# Patient Record
Sex: Female | Born: 1980 | Race: White | Hispanic: No | Marital: Single
Health system: Southern US, Community
[De-identification: ages and names within clinical notes are randomized; demographics above are authoritative.]

## PROBLEM LIST (undated history)

## (undated) DIAGNOSIS — F909 Attention-deficit hyperactivity disorder, unspecified type: Secondary | ICD-10-CM

## (undated) DIAGNOSIS — E039 Hypothyroidism, unspecified: Secondary | ICD-10-CM

## (undated) DIAGNOSIS — R110 Nausea: Secondary | ICD-10-CM

## (undated) DIAGNOSIS — G4700 Insomnia, unspecified: Secondary | ICD-10-CM

## (undated) HISTORY — DX: Attention-deficit hyperactivity disorder, unspecified type: F90.9

## (undated) HISTORY — DX: Insomnia, unspecified: G47.00

## (undated) HISTORY — PX: DILATION AND CURETTAGE OF UTERUS: SHX78

## (undated) HISTORY — DX: Nausea: R11.0

## (undated) HISTORY — DX: Hypothyroidism, unspecified: E03.9

---

## 2004-10-15 ENCOUNTER — Emergency Department (HOSPITAL_COMMUNITY): Admission: EM | Admit: 2004-10-15 | Discharge: 2004-10-15 | Payer: Self-pay | Admitting: Emergency Medicine

## 2004-10-18 ENCOUNTER — Emergency Department (HOSPITAL_COMMUNITY): Admission: EM | Admit: 2004-10-18 | Discharge: 2004-10-18 | Payer: Self-pay | Admitting: Emergency Medicine

## 2004-10-21 ENCOUNTER — Emergency Department (HOSPITAL_COMMUNITY): Admission: EM | Admit: 2004-10-21 | Discharge: 2004-10-21 | Payer: Self-pay | Admitting: Emergency Medicine

## 2007-03-14 IMAGING — CT CT ABDOMEN W/ CM
1 of 2 series · 15 of 32 positions shown, 19 images · IV contrast (omnipaque)
Comparison: None available.

CLINICAL DATA: 24-year-old female with abdominal and pelvic pain.  Right lower quadrant pain.  Negative pregnancy test.  
 ABDOMEN CT WITH CONTRAST:
TECHNIQUE: Multidetector CT imaging of the abdomen was performed following the standard protocol during bolus administration of intravenous contrast.
 Contrast:  100 cc Omnipaque 300 and oral contrast
TECHNIQUE: Multidetector CT imaging of the pelvis was performed following the standard protocol during bolus administration of intravenous contrast.
 The bladder, bowel and appendix are unremarkable.  A few small adnexal cysts/follicles are present.  There is no evidence of free fluid or enlarged lymph nodes.  The uterus and adnexal regions are grossly unremarkable.  Consider further evaluation or follow-up as indicated.

[Series 2: routine abdomen · axial · 0.62mm/px · z∈[-382,-42]mm · 15 of 75 slices shown, 19 images]
[im 4/75  soft-tissue]
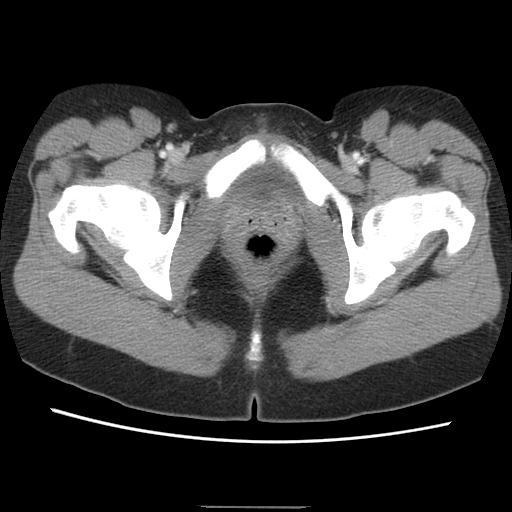
[im 4/75  bone]
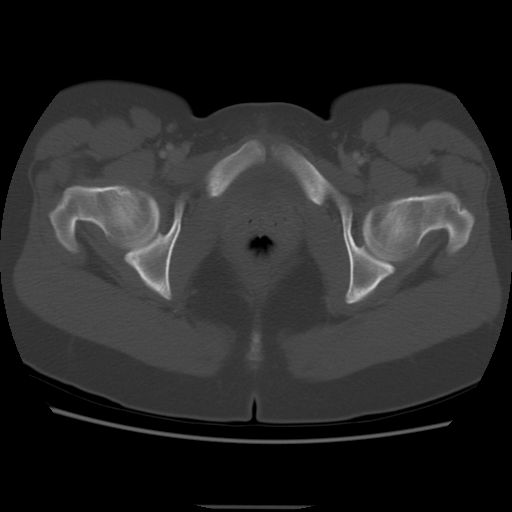
[im 10/75  soft-tissue]
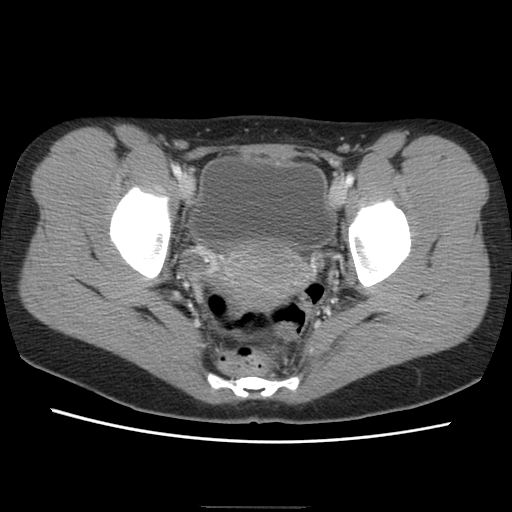
[im 17/75  soft-tissue]
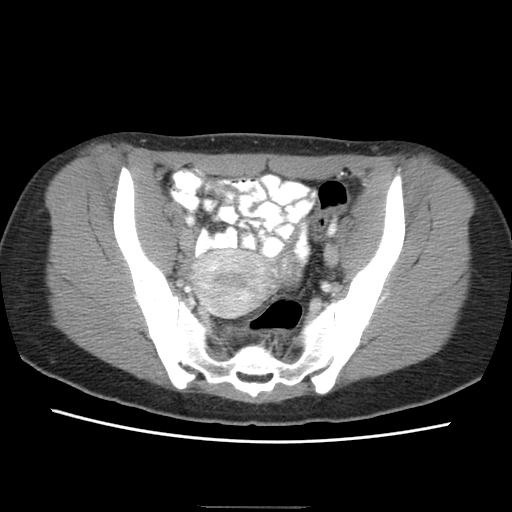
[im 20/75  soft-tissue]
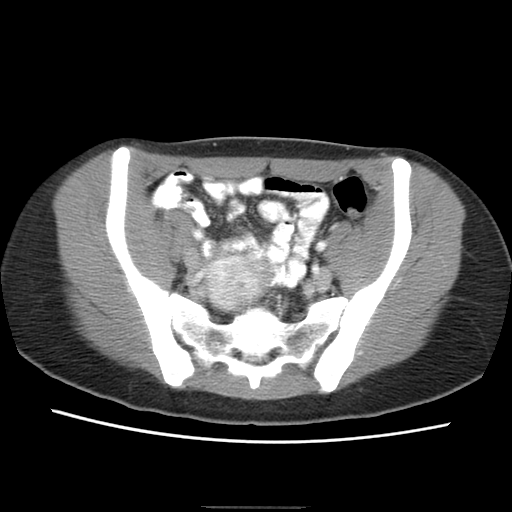
[im 26/75  soft-tissue]
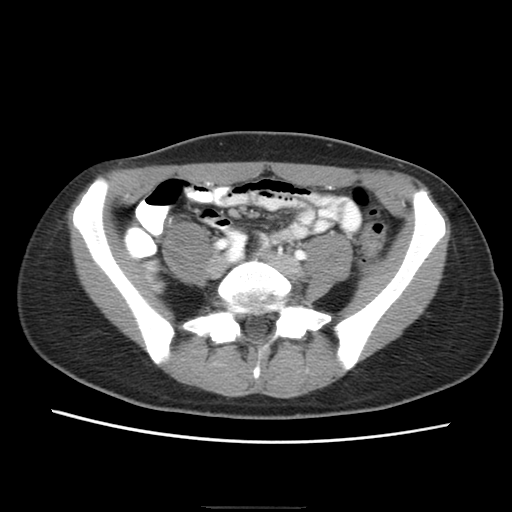
[im 33/75  soft-tissue]
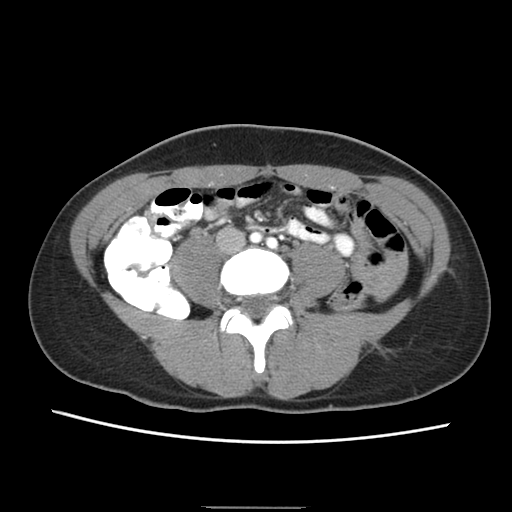
[im 39/75  soft-tissue]
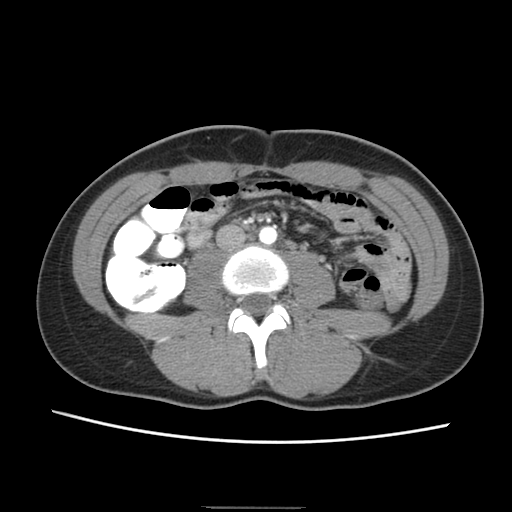
[im 42/75  soft-tissue]
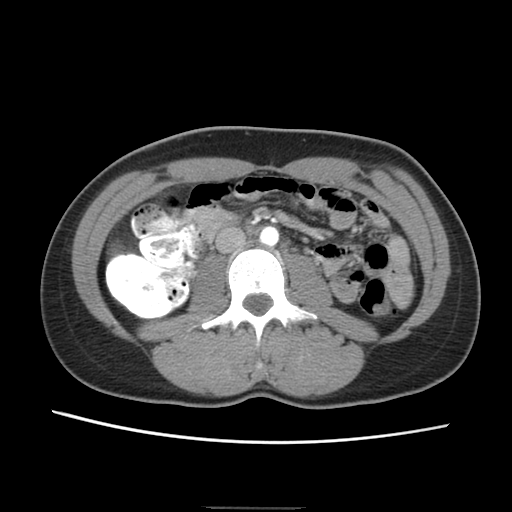
[im 49/75  soft-tissue]
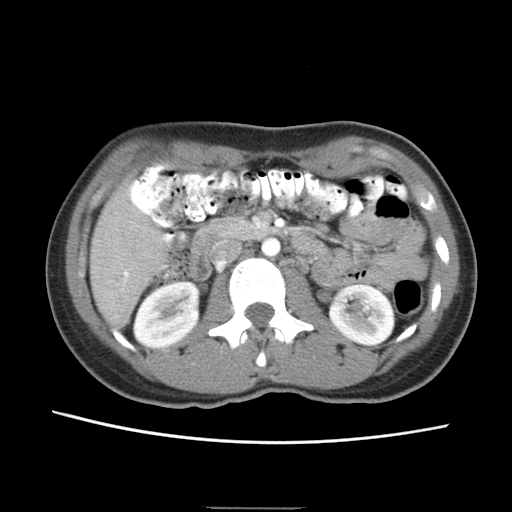
[im 49/75  bone]
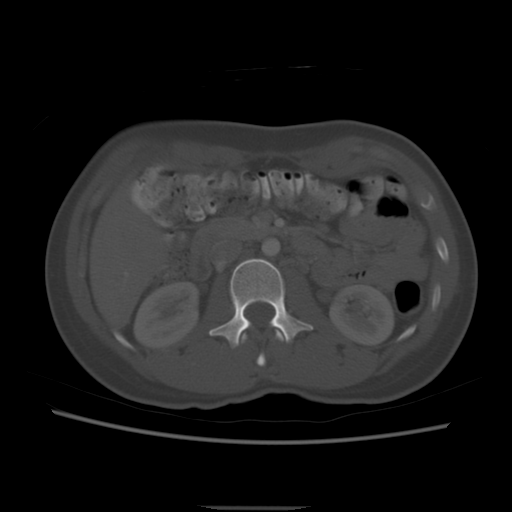
[im 55/75  soft-tissue]
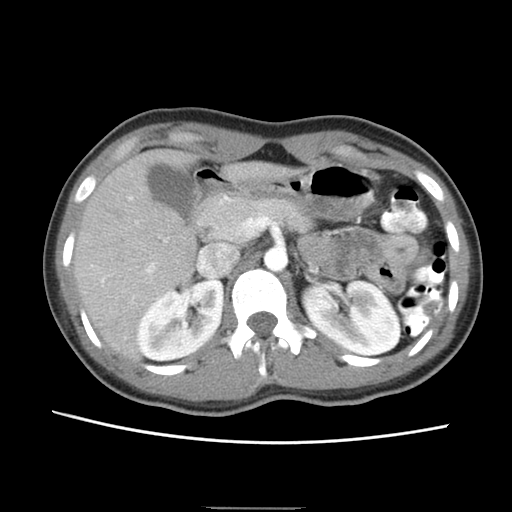
[im 58/75  soft-tissue]
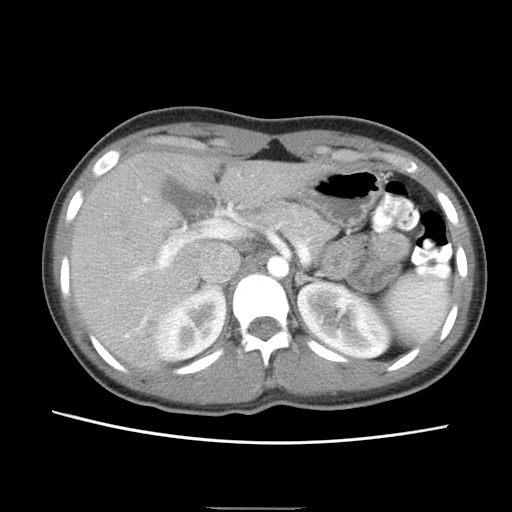
[im 62/75  lung]
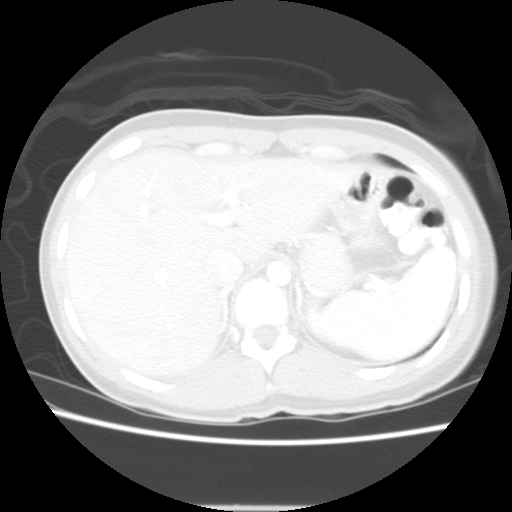
[im 65/75  soft-tissue]
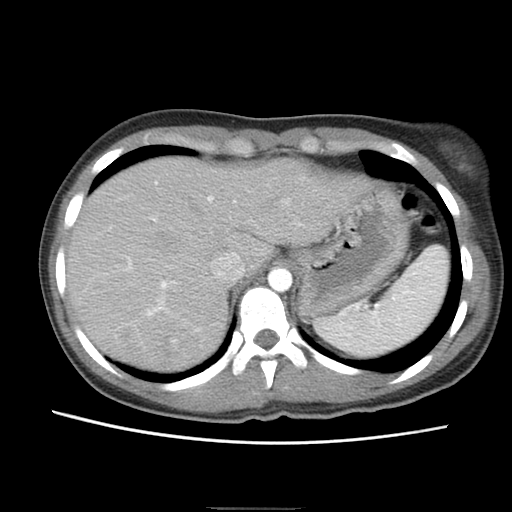
[im 65/75  lung]
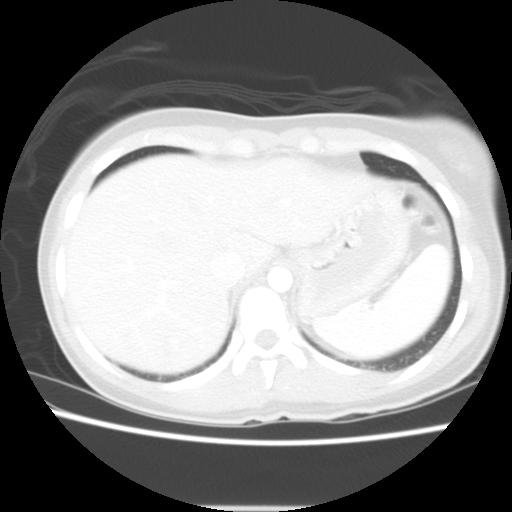
[im 68/75  lung]
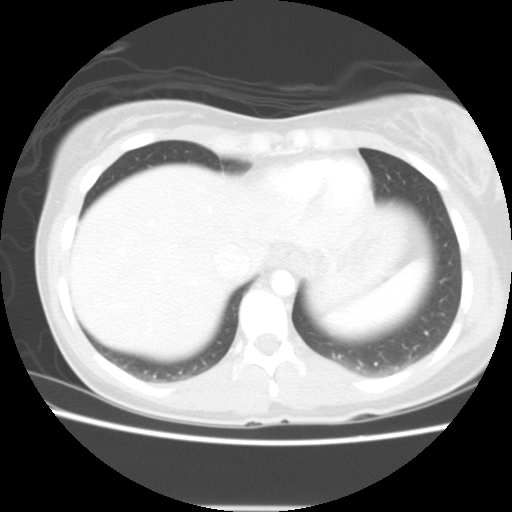
[im 71/75  soft-tissue]
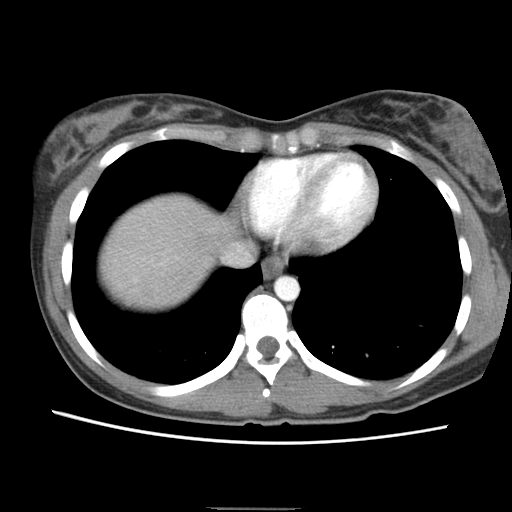
[im 71/75  lung]
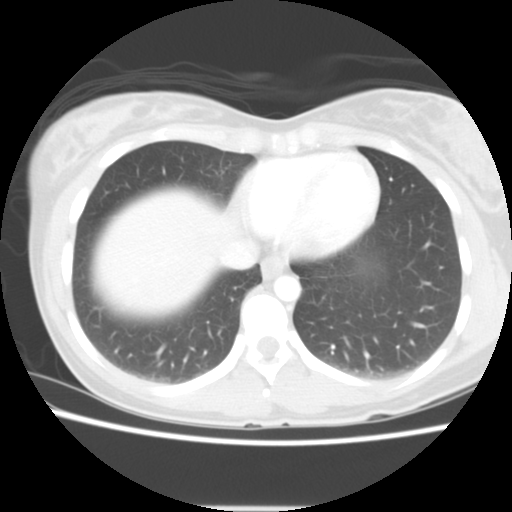

[15 of 32 positions shown; findings below may reference images not displayed]

FINDINGS: The liver, spleen, adrenal glands, kidneys, pancreas, and gallbladder are unremarkable.  No evidence of enlarged lymph nodes, free fluid, abdominal aortic aneurysm, or biliary dilatation.  The visualized bowel is within normal limits.
IMPRESSION: No acute abnormality.  
 PELVIS CT WITH CONTRAST:
IMPRESSION: No evidence of acute abnormality.

## 2007-04-20 ENCOUNTER — Inpatient Hospital Stay (HOSPITAL_COMMUNITY): Admission: AD | Admit: 2007-04-20 | Discharge: 2007-04-20 | Payer: Self-pay | Admitting: Obstetrics and Gynecology

## 2009-09-10 IMAGING — US US OB COMP LESS 14 WK
1 series · 8 of 8 positions shown · non-contrast
Comparison: none

CLINICAL DATA: Seven weeks pregnant and bleeding. Last menstrual period was 7 weeks 0 days prior. 
OBSTETRICAL ULTRASOUND <14 WKS AND TRANSVAGINAL OB US:
TECHNIQUE: Both transabdominal and transvaginal ultrasound examinations were performed for complete evaluation of the gestation as well as the maternal uterus, adnexal regions, and pelvic cul-de-sac.

[Series 1: us ob comp less 14 wk · 0.11mm/px · 8 of 8 slices shown]
[im 1/8]
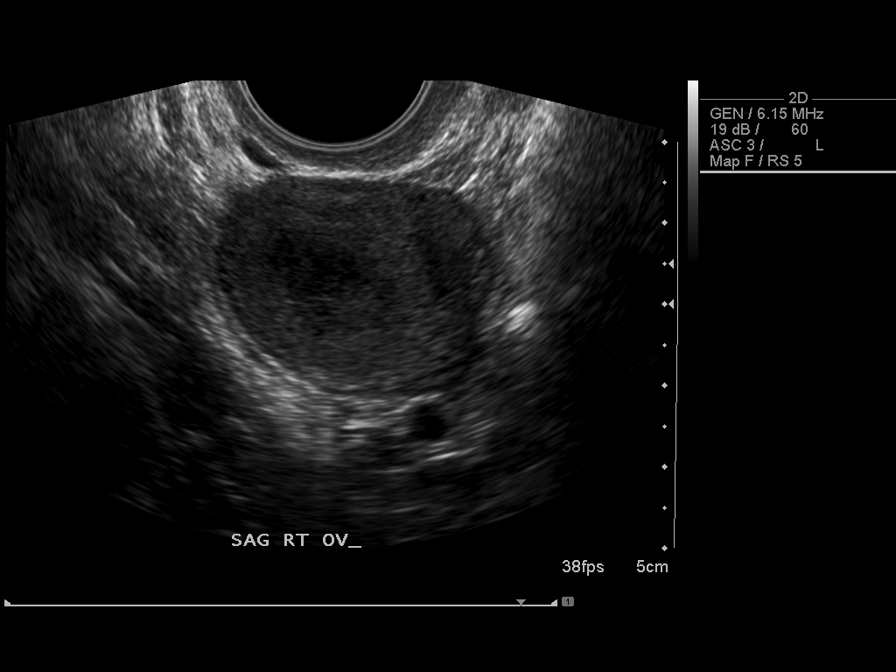
[im 2/8]
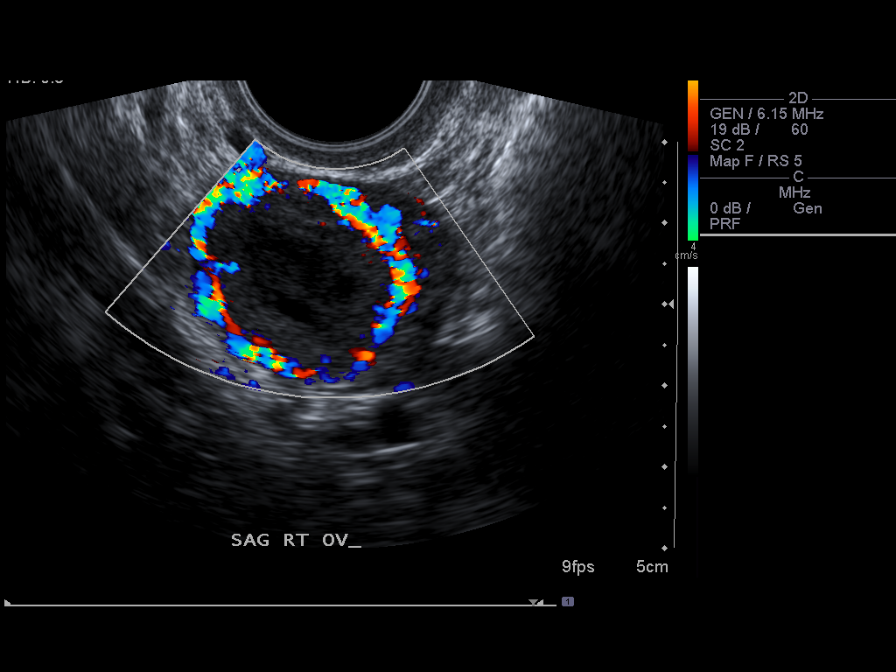
[im 3/8]
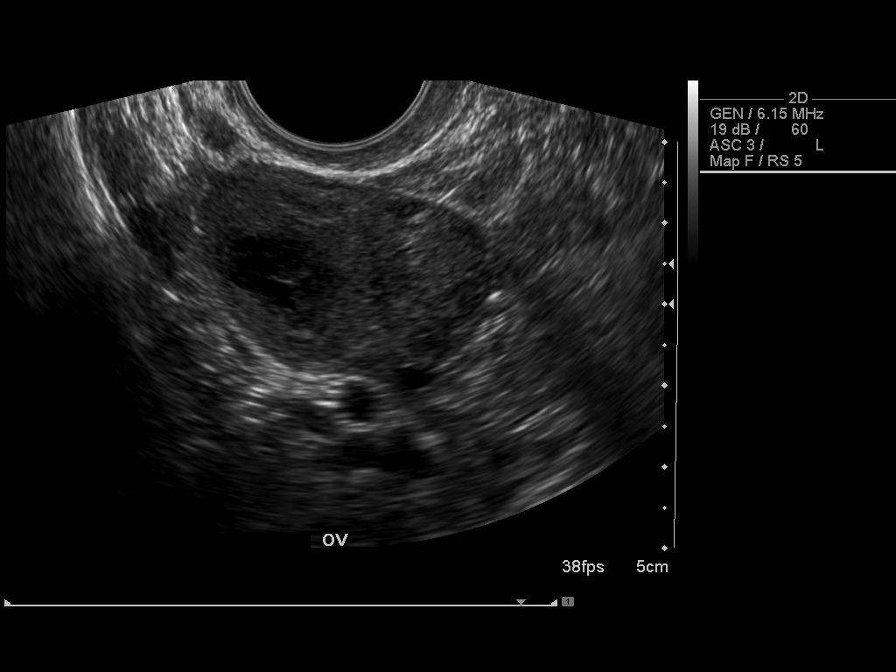
[im 4/8]
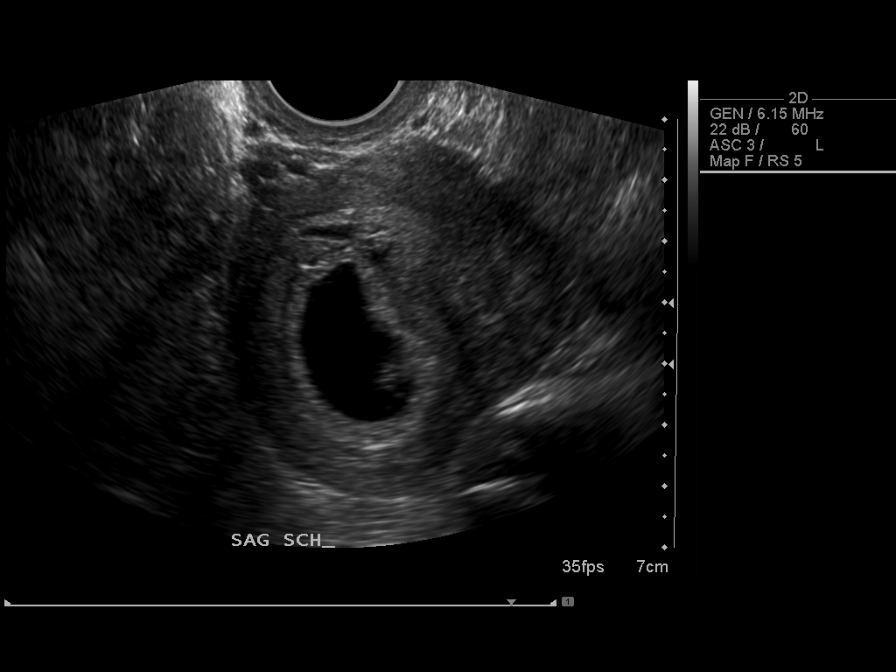
[im 5/8]
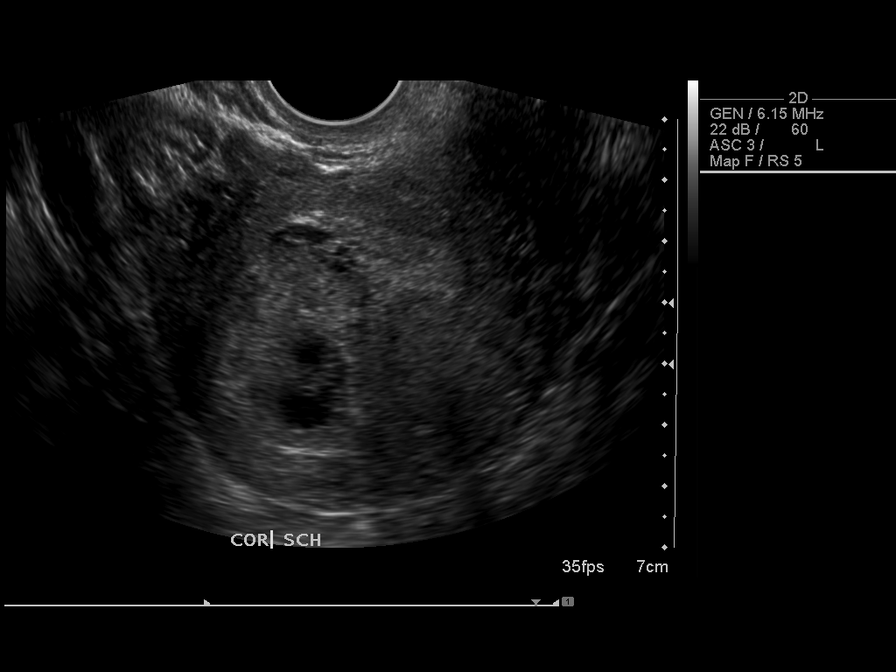
[im 6/8]
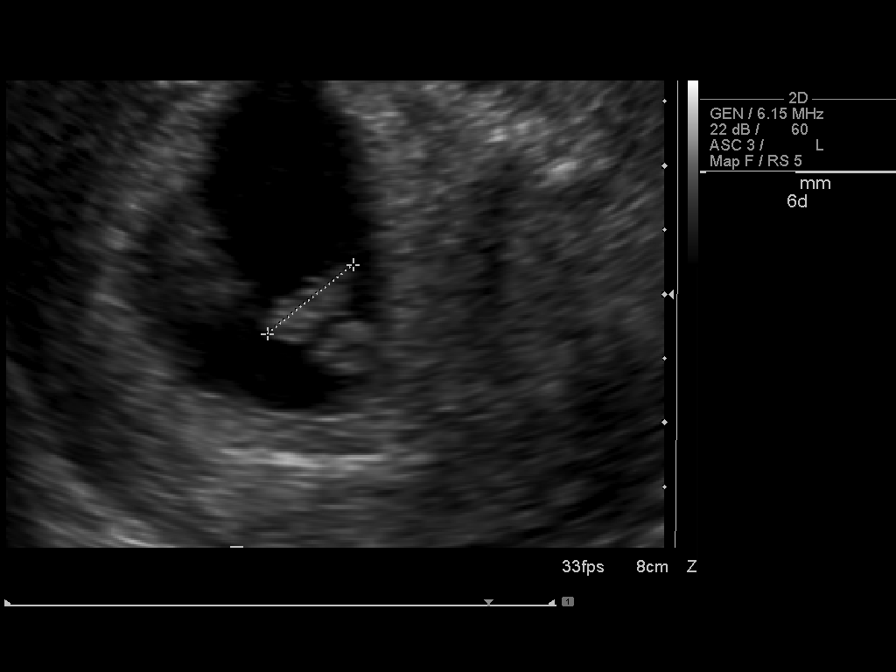
[im 7/8]
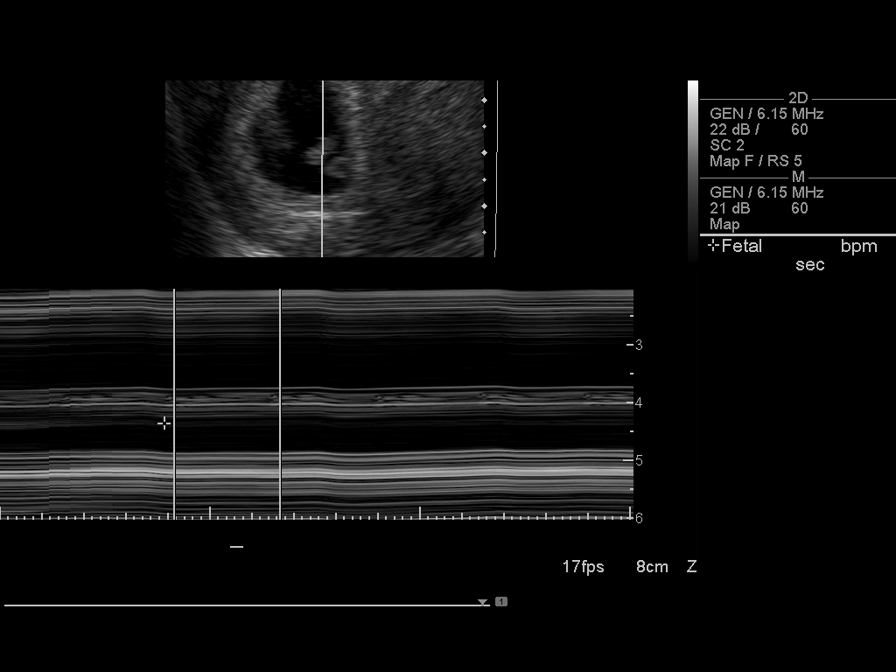
[im 8/8]
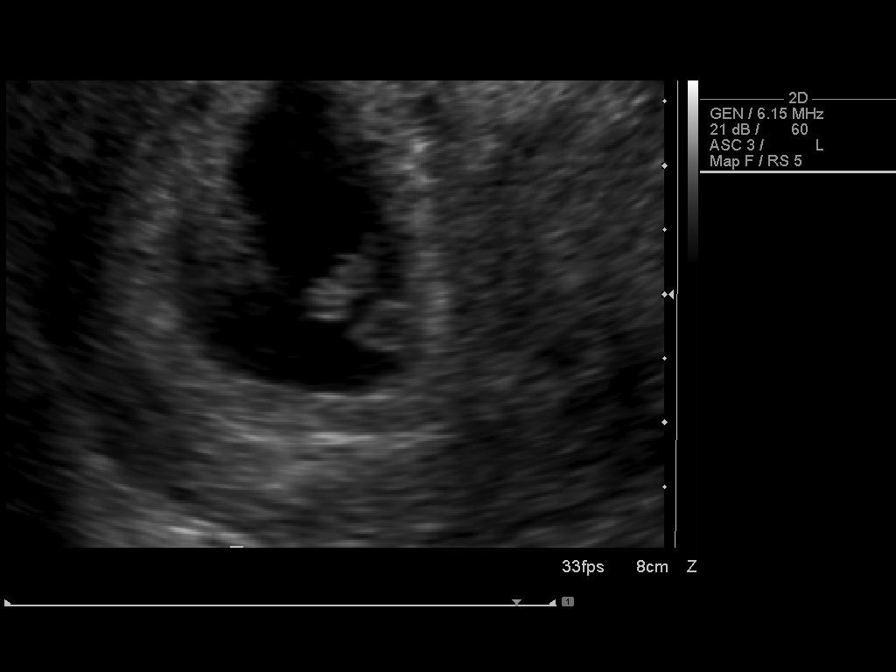

[8 of 8 positions shown; findings below may reference images not displayed]

FINDINGS: There is a single intrauterine gestational sac within the uterine fundus.  A yolk sac is present as well as an embryo.  The embryo has normal cardiac activity at 119 beats per minute.  Crown rump length of the embryo measures 8.6 mm, which calculates to an estimated gestational age of 6 weeks 6 days.  There is a small subchorionic hemorrhage.  
The right ovary has marked peripheral vascularity, measures 3.1 x 2.3 x 1.9 cm and contains central cystic structure likely representing a corpus luteal . The left ovary is normal and measures 2.3 x 1.3 x 1.5 cm.  
No free fluid in the posterior cul-de-sac.
IMPRESSION: 1.  Single live intrauterine gestation with normal cardiac activity.  
2.  Crown rump length equals 8.6 mm for a calculated gestational age of 6 weeks 6 days. 
3.  Small subchorionic hemorrhage. 
4. Vascular right ovary with likely corpus luteual cyst. Recommend follow up pelvic ultrasound in one week to rule out unlikely ectopic pregnancy.

## 2010-11-25 LAB — URINALYSIS, ROUTINE W REFLEX MICROSCOPIC
Glucose, UA: NEGATIVE
Leukocytes, UA: NEGATIVE
Specific Gravity, Urine: >1.03 — ABNORMAL HIGH
Urobilinogen, UA: 0.2
pH: 6

## 2010-11-25 LAB — URINE MICROSCOPIC-ADD ON

## 2010-11-25 LAB — WET PREP, GENITAL
Trich, Wet Prep: NONE SEEN
Yeast Wet Prep HPF POC: NONE SEEN

## 2015-06-01 ENCOUNTER — Ambulatory Visit (INDEPENDENT_AMBULATORY_CARE_PROVIDER_SITE_OTHER): Payer: BLUE CROSS/BLUE SHIELD | Admitting: Allergy and Immunology

## 2015-06-01 ENCOUNTER — Encounter: Payer: Self-pay | Admitting: Allergy and Immunology

## 2015-06-01 VITALS — BP 116/64 | HR 84 | Temp 98.7°F | Resp 20 | Ht 58.66 in | Wt 165.3 lb

## 2015-06-01 DIAGNOSIS — H1010 Acute atopic conjunctivitis, unspecified eye: Secondary | ICD-10-CM | POA: Diagnosis not present

## 2015-06-01 DIAGNOSIS — J309 Allergic rhinitis, unspecified: Secondary | ICD-10-CM

## 2015-06-01 DIAGNOSIS — T7840XA Allergy, unspecified, initial encounter: Secondary | ICD-10-CM

## 2015-06-01 MED ORDER — EPINEPHRINE 0.3 MG/0.3ML IJ SOAJ: INTRAMUSCULAR | Status: AC

## 2015-06-01 NOTE — Progress Notes (Signed)
NEW PATIENT NOTE  Referring Provider: No ref. provider found Primary Provider: Feliciana RossettiGRISSO,GREG, MD Date of office visit: 06/01/2015    Subjective:   Chief Complaint:  Kristy HippsHeather E Thompson (DOB: 07-28-1980) is a 35 y.o. female with a chief complaint of Allergic Reaction and Allergic Rhinitis   who presents to the clinic on 06/01/2015 with the following problems:  HPI Comments: Kristy SetaHeather presents this clinic in evaluation of an allergic reaction that occurred approximately 2 weeks ago. Within 1-2 hours she develop total body urticaria, throat clearing, and shortness of breath and went to the emergency room and was treated with systemic steroids and her reaction resolved within approximately 1 week. Her hives never healed with scar or hyperpigmentation. She did not have any associated constitutional or systemic symptoms. There was no obvious trigger although she was exposed to a new type of washing machine material placed in her wash. It was the first day of exposure that she developed this problem.  She also has a history of nasal congestion and sneezing occurring on a perennial basis with spring and fall exacerbation for which he uses cetirizine which works very well. Otherwise, she has no other associated atopic symptoms.   Past Medical History  Diagnosis Date  . Hypothyroidism   . ADHD (attention deficit hyperactivity disorder)   . Nausea   . Insomnia     Past Surgical History  Procedure Laterality Date  . Dilation and curettage of uterus  11-27-07 AND 11-29-07      Medication List           cetirizine 10 MG tablet  Commonly known as:  ZYRTEC  Take 10 mg by mouth daily.     Dexmethylphenidate HCl 25 MG Cp24  Take 25 mg by mouth daily.     levothyroxine 75 MCG tablet  Commonly known as:  SYNTHROID, LEVOTHROID  Take 75 mcg by mouth daily.     liothyronine 5 MCG tablet  Commonly known as:  CYTOMEL  Take 5 mcg by mouth daily.     ondansetron 4 MG tablet  Commonly known as:   ZOFRAN  Take 4 mg by mouth as needed.     temazepam 22.5 MG capsule  Commonly known as:  RESTORIL  Take 22.5 mg by mouth at bedtime as needed.        Allergies  Allergen Reactions  . Omnicef [Cefdinir] Hives    Review of systems negative except as noted in HPI / PMHx or noted below:  Review of Systems  Constitutional: Negative.   HENT: Negative.   Eyes: Negative.   Respiratory: Negative.   Cardiovascular: Negative.   Gastrointestinal: Negative.   Genitourinary: Negative.   Musculoskeletal: Negative.   Skin: Negative.   Neurological: Negative.   Endo/Heme/Allergies: Negative.   Psychiatric/Behavioral: Negative.     Family History  Problem Relation Age of Onset  . Hypothyroidism Mother   . Cervical cancer Mother   . Kidney failure Mother     Social History   Social History  . Marital Status: Single    Spouse Name: N/A  . Number of Children: N/A  . Years of Education: N/A   Occupational History  . Not on file.   Social History Main Topics  . Smoking status: Passive Smoke Exposure - Never Smoker  . Smokeless tobacco: Never Used  . Alcohol Use: Yes  . Drug Use: No  . Sexual Activity: Not on file   Other Topics Concern  . Not on file  Social History Narrative  . No narrative on file    Environmental and Social history  Lives in a house with a dry environment, no carpeting in the bedroom, a dog located inside the household, no plastic on the bed or pillow, and a smoker located inside the household. She works as a Writer:   Filed Vitals:   06/01/15 1406  BP: 116/64  Pulse: 84  Temp: 98.7 F (37.1 C)  Resp: 20   Height: 4' 10.66" (149 cm) Weight: 165 lb 5.5 oz (75 kg)  Physical Exam  Constitutional: She is well-developed, well-nourished, and in no distress.  HENT:  Head: Normocephalic. Head is without right periorbital erythema and without left periorbital erythema.  Right Ear: Tympanic membrane, external ear and ear canal normal.    Left Ear: Tympanic membrane, external ear and ear canal normal.  Nose: Nose normal. No mucosal edema or rhinorrhea.  Mouth/Throat: Uvula is midline, oropharynx is clear and moist and mucous membranes are normal. No oropharyngeal exudate.  Eyes: Conjunctivae and lids are normal. Pupils are equal, round, and reactive to light.  Neck: Trachea normal. No tracheal tenderness present. No tracheal deviation present. No thyromegaly present.  Cardiovascular: Normal rate, regular rhythm, S1 normal, S2 normal and normal heart sounds.   No murmur heard. Pulmonary/Chest: Effort normal and breath sounds normal. No stridor. No tachypnea. No respiratory distress. She has no wheezes. She has no rales. She exhibits no tenderness.  Abdominal: Soft. She exhibits no distension and no mass. There is no hepatosplenomegaly. There is no tenderness. There is no rebound and no guarding.  Musculoskeletal: She exhibits no edema or tenderness.  Lymphadenopathy:       Head (right side): No tonsillar adenopathy present.       Head (left side): No tonsillar adenopathy present.    She has no cervical adenopathy.    She has no axillary adenopathy.  Neurological: She is alert. Gait normal.  Skin: No rash noted. She is not diaphoretic. No erythema. No pallor. Nails show no clubbing.  Psychiatric: Mood and affect normal.     Diagnostics:  None  Assessment and Plan:    1. Allergic reaction, initial encounter   2. Allergic rhinoconjunctivitis     1. EpiPen, Benadryl, M.D./ ER evaluation for allergic reaction  2. Contact me if future reactions: Will require further evaluation  3. Continue cetirizine 10 one tablet once a day if needed  I long talk today with [name] about how to approach her isolated allergic reaction. She will avoid the washing machine supplement material and will just move forward without any additional evaluation or treatment at this point in time and assume she will not have a recurrent reaction.  Certainly if she has recurrent reactions she will require further evaluation. I've asked her to contact me should she develop any type of problem in the near future as we move forward.    [name], MD [facility]

## 2015-06-01 NOTE — Patient Instructions (Addendum)
  1. EpiPen, Benadryl, M.D./ ER evaluation for allergic reaction  2. Contact me if future reactions: Will require further evaluation  3. Continue cetirizine 10 ONE tablet once a day if needed

## 2023-10-17 ENCOUNTER — Emergency Department (HOSPITAL_COMMUNITY)
Admission: EM | Admit: 2023-10-17 | Discharge: 2023-10-18 | Disposition: A | Discharge status: Home / Self Care | Source: Other Acute Inpatient Hospital | Attending: Emergency Medicine | Admitting: Emergency Medicine

## 2023-10-17 ENCOUNTER — Encounter (HOSPITAL_COMMUNITY): Payer: Self-pay | Admitting: Emergency Medicine

## 2023-10-17 ENCOUNTER — Other Ambulatory Visit: Payer: Self-pay

## 2023-10-17 DIAGNOSIS — I9589 Other hypotension: Secondary | ICD-10-CM | POA: Insufficient documentation

## 2023-10-17 DIAGNOSIS — E861 Hypovolemia: Secondary | ICD-10-CM | POA: Insufficient documentation

## 2023-10-17 DIAGNOSIS — E876 Hypokalemia: Secondary | ICD-10-CM | POA: Diagnosis not present

## 2023-10-17 DIAGNOSIS — I959 Hypotension, unspecified: Secondary | ICD-10-CM | POA: Diagnosis present

## 2023-10-17 LAB — CBC WITH DIFFERENTIAL/PLATELET
Abs Immature Granulocytes: 0.11 K/uL — ABNORMAL HIGH (ref 0.00–0.07)
Basophils Absolute: 0 K/uL (ref 0.0–0.1)
Basophils Relative: 0 %
Eosinophils Absolute: 0 K/uL (ref 0.0–0.5)
Eosinophils Relative: 0 %
HCT: 38.1 % (ref 36.0–46.0)
Hemoglobin: 12.7 g/dL (ref 12.0–15.0)
Immature Granulocytes: 1 %
Lymphocytes Relative: 8 %
Lymphs Abs: 1.2 K/uL (ref 0.7–4.0)
MCH: 30.6 pg (ref 26.0–34.0)
MCHC: 33.3 g/dL (ref 30.0–36.0)
MCV: 91.8 fL (ref 80.0–100.0)
Monocytes Absolute: 0.6 K/uL (ref 0.1–1.0)
Monocytes Relative: 4 %
Neutro Abs: 14.4 K/uL — ABNORMAL HIGH (ref 1.7–7.7)
Neutrophils Relative %: 87 %
Platelets: 307 K/uL (ref 150–400)
RBC: 4.15 MIL/uL (ref 3.87–5.11)
RDW: 12.8 % (ref 11.5–15.5)
WBC: 16.4 K/uL — ABNORMAL HIGH (ref 4.0–10.5)
nRBC: 0 % (ref 0.0–0.2)

## 2023-10-17 MED ORDER — SODIUM CHLORIDE 0.9 % IV BOLUS
1000.0000 mL | Freq: Once | INTRAVENOUS | Status: AC
  Administered 2023-10-18 (×2): 1000 mL via INTRAVENOUS

## 2023-10-17 NOTE — ED Triage Notes (Signed)
 Pt coming from plasma center. Pt had 800cc taken off and was unable to get it back per facility. Pt hypotensive and ems called. Pt given 1l of fluid per ems and pt not hypotensive anymore

## 2023-10-17 NOTE — ED Provider Notes (Signed)
 [place] EMERGENCY DEPARTMENT AT [facility] Provider Note   CSN: [id number] Arrival date & time: 10/17/23  2258     Patient presents with: Hypotension   Kristy Thompson is a 43 y.o. female.   The history is provided by the patient, the EMS personnel and medical records.  Kristy Thompson is a 43 y.o. female who presents to the Emergency Department complaining of hypotension.  She presents to the emergency department by EMS for evaluation of hypotension.  She was donating plasma today and they were unable to return her wash red cells back to her and she became near syncopal with feeling hot, lightheaded with nausea.  The plasma center was unable to obtain a consistent blood pressure on her and this lasted several hours.  It started around 730.  She did try to drink 2 Gatorade's but did not vomit.  EMS gave her 1 L of fluids.  She does continue to feel slightly lightheaded but feels better than she did earlier.  She does have a history of hypothyroidism, no additional medical problems.  She had a routine office visit yesterday.  No recent illnesses.  No chance of pregnancy.     Prior to Admission medications   Medication Sig Start Date End Date Taking? Authorizing Provider  cetirizine (ZYRTEC) 10 MG tablet Take 10 mg by mouth daily.    [provider]  Dexmethylphenidate HCl 25 MG CP24 Take 25 mg by mouth daily. 05/10/15   [provider]  EPINEPHrine  (EPIPEN  2-PAK) 0.3 mg/0.3 mL IJ SOAJ injection Use as directed for life-threatening allergic reaction. 06/01/15   Kozlow, Eric J, MD  levothyroxine (SYNTHROID, LEVOTHROID) 75 MCG tablet Take 75 mcg by mouth daily. 05/26/15   [provider]  liothyronine (CYTOMEL) 5 MCG tablet Take 5 mcg by mouth daily. 05/27/15   [provider]  ondansetron (ZOFRAN) 4 MG tablet Take 4 mg by mouth as needed.    [provider]  temazepam (RESTORIL) 22.5 MG capsule Take 22.5 mg by mouth at bedtime as needed.     [provider]    Allergies: Omnicef [cefdinir]    Review of Systems  All other systems reviewed and are negative.   Updated Vital Signs BP 104/71   Pulse 94   Temp 98.4 F (36.9 C) (Oral)   Resp 20   SpO2 99%   Physical Exam Vitals and nursing note reviewed.  Constitutional:      Appearance: She is well-developed.  HENT:     Head: Normocephalic and atraumatic.  Cardiovascular:     Rate and Rhythm: Normal rate and regular rhythm.     Heart sounds: No murmur heard. Pulmonary:     Effort: Pulmonary effort is normal. No respiratory distress.     Breath sounds: Normal breath sounds.  Abdominal:     Palpations: Abdomen is soft.     Tenderness: There is no abdominal tenderness. There is no guarding or rebound.  Musculoskeletal:        General: No tenderness.  Skin:    General: Skin is warm and dry.  Neurological:     Mental Status: She is alert and oriented to person, place, and time.  Psychiatric:        Behavior: Behavior normal.     (all labs ordered are listed, but only abnormal results are displayed) Labs Reviewed  COMPREHENSIVE METABOLIC PANEL WITH GFR - Abnormal; Notable for the following components:      Result Value  Potassium 3.4 (*)    Chloride 112 (*)    CO2 20 (*)    Calcium  7.0 (*)    Total Protein 4.3 (*)    Albumin 2.5 (*)    Alkaline Phosphatase 29 (*)    All other components within normal limits  CBC WITH DIFFERENTIAL/PLATELET - Abnormal; Notable for the following components:   WBC 16.4 (*)    Neutro Abs 14.4 (*)    Abs Immature Granulocytes 0.11 (*)    All other components within normal limits  URINALYSIS, W/ REFLEX TO CULTURE (INFECTION SUSPECTED) - Abnormal; Notable for the following components:   APPearance HAZY (*)    Glucose, UA 50 (*)    Bacteria, UA RARE (*)    All other components within normal limits    EKG: EKG Interpretation Date/Time:  Wednesday October 17 2023 23:31:49 EDT Ventricular Rate:  98 PR  Interval:  124 QRS Duration:  78 QT Interval:  338 QTC Calculation: 431 R Axis:   49  Text Interpretation: Normal sinus rhythm Normal ECG No previous ECGs available Confirmed by Griselda Norris [phone]) on 10/17/2023 11:41:15 PM  Radiology: No results found.   Procedures   Medications Ordered in the ED  sodium chloride  0.9 % bolus 1,000 mL (0 mLs Intravenous Stopped 10/18/23 0050)  potassium chloride  SA (KLOR-CON  M) CR tablet 20 mEq (20 mEq Oral Given 10/18/23 0026)  calcium  carbonate (TUMS - dosed in mg elemental calcium ) chewable tablet 200 mg of elemental calcium  (200 mg of elemental calcium  Oral Given 10/18/23 0025)                                    Medical Decision Making Amount and/or Complexity of Data Reviewed Labs: ordered.  Risk OTC drugs. Prescription drug management.   Patient here for evaluation of hypotension after giving plasma and not being able to receive all of her red blood cells back.  She did have hypotension prior to ED arrival, resolved with IV fluid by EMS.  CBC with leukocytosis, no definite evidence of infection.  Labs are significant for hypocalcemia, mild hypokalemia.  No anemia.  Patient denies any chance of pregnancy.  On repeat evaluation she is feeling improved.  Discussed oral supplementation for hypocalcemia, hypokalemia.  Feel she is stable for discharge home with outpatient follow-up and return precautions.  Discussed that she should not donate plasma for a minimum of 1 week.     Final diagnoses:  Hypotension due to hypovolemia  Hypocalcemia    ED Discharge Orders     None          Griselda Norris, MD 10/18/23 [phone]

## 2023-10-18 LAB — COMPREHENSIVE METABOLIC PANEL WITH GFR
ALT: 11 U/L (ref 0–44)
AST: 15 U/L (ref 15–41)
Albumin: 2.5 g/dL — ABNORMAL LOW (ref 3.5–5.0)
Alkaline Phosphatase: 29 U/L — ABNORMAL LOW (ref 38–126)
Anion gap: 7 (ref 5–15)
BUN: 9 mg/dL (ref 6–20)
CO2: 20 mmol/L — ABNORMAL LOW (ref 22–32)
Calcium: 7 mg/dL — ABNORMAL LOW (ref 8.9–10.3)
Chloride: 112 mmol/L — ABNORMAL HIGH (ref 98–111)
Creatinine, Ser: 0.98 mg/dL (ref 0.44–1.00)
GFR, Estimated: >60 mL/min (ref >60)
Glucose, Bld: 87 mg/dL (ref 70–99)
Potassium: 3.4 mmol/L — ABNORMAL LOW (ref 3.5–5.1)
Sodium: 139 mmol/L (ref 135–145)
Total Bilirubin: 0.5 mg/dL (ref 0.0–1.2)
Total Protein: 4.3 g/dL — ABNORMAL LOW (ref 6.5–8.1)

## 2023-10-18 LAB — URINALYSIS, W/ REFLEX TO CULTURE (INFECTION SUSPECTED)
Bilirubin Urine: NEGATIVE
Glucose, UA: 50 mg/dL — AB
Hgb urine dipstick: NEGATIVE
Ketones, ur: NEGATIVE mg/dL
Leukocytes,Ua: NEGATIVE
Nitrite: NEGATIVE
Protein, ur: NEGATIVE mg/dL
Specific Gravity, Urine: 1.01 (ref 1.005–1.030)
pH: 6 (ref 5.0–8.0)

## 2023-10-18 MED ORDER — POTASSIUM CHLORIDE CRYS ER 20 MEQ PO TBCR
20.0000 meq | EXTENDED_RELEASE_TABLET | Freq: Once | ORAL | Status: AC
  Administered 2023-10-18: 20 meq via ORAL
  Filled 2023-10-18: qty 1

## 2023-10-18 MED ORDER — CALCIUM CARBONATE ANTACID 500 MG PO CHEW
1.0000 | CHEWABLE_TABLET | Freq: Once | ORAL | Status: AC
  Administered 2023-10-18: 200 mg via ORAL
  Filled 2023-10-18: qty 1

## 2023-10-18 NOTE — ED Notes (Signed)
 Pt discharged. Pt given discharge papers and papers explained. Pt in NAD at this time
